# Patient Record
Sex: Male | Born: 1980 | Race: White | Hispanic: No | Marital: Married | Smoking: Never smoker
Health system: Southern US, Community
[De-identification: ages and names within clinical notes are randomized; demographics above are authoritative.]

## PROBLEM LIST (undated history)

## (undated) HISTORY — PX: COLONOSCOPY: SHX174

## (undated) HISTORY — PX: WISDOM TOOTH EXTRACTION: SHX21

---

## 2002-07-12 ENCOUNTER — Emergency Department (HOSPITAL_COMMUNITY): Admission: EM | Admit: 2002-07-12 | Discharge: 2002-07-12 | Payer: Self-pay | Admitting: Emergency Medicine

## 2002-10-30 ENCOUNTER — Emergency Department (HOSPITAL_COMMUNITY): Admission: EM | Admit: 2002-10-30 | Discharge: 2002-10-30 | Payer: Self-pay | Admitting: Emergency Medicine

## 2003-03-18 ENCOUNTER — Emergency Department (HOSPITAL_COMMUNITY): Admission: AD | Admit: 2003-03-18 | Discharge: 2003-03-18 | Payer: Self-pay | Admitting: Family Medicine

## 2003-03-24 ENCOUNTER — Emergency Department (HOSPITAL_COMMUNITY): Admission: AD | Admit: 2003-03-24 | Discharge: 2003-03-24 | Payer: Self-pay | Admitting: Family Medicine

## 2003-04-21 ENCOUNTER — Emergency Department (HOSPITAL_COMMUNITY): Admission: EM | Admit: 2003-04-21 | Discharge: 2003-04-21 | Payer: Self-pay | Admitting: Emergency Medicine

## 2005-10-06 ENCOUNTER — Emergency Department (HOSPITAL_COMMUNITY): Admission: EM | Admit: 2005-10-06 | Discharge: 2005-10-06 | Payer: Self-pay | Admitting: Emergency Medicine

## 2008-02-20 ENCOUNTER — Emergency Department (HOSPITAL_BASED_OUTPATIENT_CLINIC_OR_DEPARTMENT_OTHER): Admission: EM | Admit: 2008-02-20 | Discharge: 2008-02-20 | Payer: Self-pay | Admitting: Emergency Medicine

## 2010-08-31 ENCOUNTER — Ambulatory Visit (INDEPENDENT_AMBULATORY_CARE_PROVIDER_SITE_OTHER): Payer: 59 | Admitting: Family Medicine

## 2010-08-31 ENCOUNTER — Encounter: Payer: Self-pay | Admitting: Family Medicine

## 2010-08-31 VITALS — BP 124/88 | Ht 71.0 in | Wt 155.0 lb

## 2010-08-31 DIAGNOSIS — S92309A Fracture of unspecified metatarsal bone(s), unspecified foot, initial encounter for closed fracture: Secondary | ICD-10-CM | POA: Insufficient documentation
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
## 2010-09-20 ENCOUNTER — Ambulatory Visit (INDEPENDENT_AMBULATORY_CARE_PROVIDER_SITE_OTHER): Payer: 59 | Admitting: Family Medicine

## 2010-09-20 ENCOUNTER — Encounter: Payer: Self-pay | Admitting: Family Medicine

## 2010-09-20 VITALS — BP 130/89 | HR 79

## 2010-09-20 DIAGNOSIS — S92309A Fracture of unspecified metatarsal bone(s), unspecified foot, initial encounter for closed fracture: Secondary | ICD-10-CM

## 2010-09-20 NOTE — Progress Notes (Signed)
  Subjective:    Patient ID: Randall Webb, male    DOB: March 21, 1981, 30 y.o.   MRN: [id number]  HPI  Followup metatarsal fracture. Overall 70% improved. Has been wearing the postop shoe at the gym and most of the time when he is not at work. He has not been running. He tried the elliptical machine and was painful so he did not do that he is essentially pain free while at rest, standing, and walking.  Review of Systems    denies fever. Objective:   Physical Exam    GENERAL: Well-developed male no acute distress FOOT: Right foot nontender to palpation over the metatarsals. He does have some pain with resisted eversion and inversion. He is Neurovascularly intact. SKIN: Well-healed scar or some of the foot    Assessment & Plan:  Metatarsal fracture healing well. We'll continue the postop shoe during exercise. We'll let him try the elliptical. If it is painful he will refrain for a week and then retry it. I would like to see him back one to 2 weeks after he is able to do the elliptical without pain while in the postop shoe. At that point we can discuss leading to run. He is comfortable with this plan.

## 2010-12-28 LAB — COMPREHENSIVE METABOLIC PANEL
AST: 28
Albumin: 5.1
Calcium: 9.6
Creatinine, Ser: 1.2
GFR calc Af Amer: >60
GFR calc non Af Amer: >60

## 2010-12-28 LAB — DIFFERENTIAL
Eosinophils Relative: 2
Lymphocytes Relative: 12
Lymphs Abs: 1

## 2010-12-28 LAB — URINE MICROSCOPIC-ADD ON

## 2010-12-28 LAB — URINALYSIS, ROUTINE W REFLEX MICROSCOPIC
Leukocytes, UA: NEGATIVE
Nitrite: NEGATIVE
Specific Gravity, Urine: 1.031 — ABNORMAL HIGH
pH: 5.5

## 2010-12-28 LAB — CBC
MCHC: 33.3
MCV: 89.9
Platelets: 221

## 2010-12-28 LAB — LIPASE, BLOOD: Lipase: 56

## 2010-12-28 LAB — URINE CULTURE
Colony Count: NO GROWTH
Culture: NO GROWTH

## 2011-09-13 ENCOUNTER — Other Ambulatory Visit: Payer: Self-pay | Admitting: Family Medicine

## 2011-09-13 DIAGNOSIS — N179 Acute kidney failure, unspecified: Secondary | ICD-10-CM

## 2011-09-19 ENCOUNTER — Ambulatory Visit
Admission: RE | Admit: 2011-09-19 | Discharge: 2011-09-19 | Disposition: A | Discharge status: Home / Self Care | Payer: 59 | Source: Ambulatory Visit | Attending: Family Medicine | Admitting: Family Medicine

## 2011-09-19 DIAGNOSIS — N179 Acute kidney failure, unspecified: Secondary | ICD-10-CM

## 2014-07-11 ENCOUNTER — Ambulatory Visit: Payer: Self-pay | Admitting: Urology

## 2014-09-26 ENCOUNTER — Ambulatory Visit: Payer: Self-pay | Admitting: Urology

## 2014-10-20 ENCOUNTER — Other Ambulatory Visit: Payer: Self-pay | Admitting: Orthopedic Surgery

## 2014-10-20 DIAGNOSIS — M25511 Pain in right shoulder: Secondary | ICD-10-CM

## 2014-10-28 ENCOUNTER — Ambulatory Visit
Admission: RE | Admit: 2014-10-28 | Discharge: 2014-10-28 | Disposition: A | Discharge status: Home / Self Care | Payer: Commercial Managed Care - HMO | Source: Ambulatory Visit | Attending: Orthopedic Surgery | Admitting: Orthopedic Surgery

## 2014-10-28 DIAGNOSIS — M25511 Pain in right shoulder: Secondary | ICD-10-CM

## 2014-11-11 ENCOUNTER — Other Ambulatory Visit: Payer: Self-pay | Admitting: Orthopedic Surgery

## 2014-11-28 ENCOUNTER — Ambulatory Visit: Payer: Commercial Managed Care - HMO | Admitting: Urology

## 2014-12-04 ENCOUNTER — Encounter (HOSPITAL_COMMUNITY): Payer: Self-pay

## 2014-12-04 ENCOUNTER — Encounter (HOSPITAL_COMMUNITY)
Admission: RE | Admit: 2014-12-04 | Discharge: 2014-12-04 | Disposition: A | Discharge status: Home / Self Care | Payer: Commercial Managed Care - HMO | Source: Ambulatory Visit | Attending: Orthopedic Surgery | Admitting: Orthopedic Surgery

## 2014-12-04 DIAGNOSIS — M7541 Impingement syndrome of right shoulder: Secondary | ICD-10-CM | POA: Diagnosis not present

## 2014-12-04 DIAGNOSIS — Z01812 Encounter for preprocedural laboratory examination: Secondary | ICD-10-CM | POA: Insufficient documentation

## 2014-12-04 LAB — CBC
HCT: 38 % — ABNORMAL LOW (ref 39.0–52.0)
HEMOGLOBIN: 12.8 g/dL — AB (ref 13.0–17.0)
MCH: 29.3 pg (ref 26.0–34.0)
MCHC: 33.7 g/dL (ref 30.0–36.0)
MCV: 87 fL (ref 78.0–100.0)
Platelets: 190 10*3/uL (ref 150–400)
RBC: 4.37 MIL/uL (ref 4.22–5.81)
RDW: 12.8 % (ref 11.5–15.5)
WBC: 3.6 10*3/uL — AB (ref 4.0–10.5)

## 2014-12-04 NOTE — Pre-Procedure Instructions (Signed)
    HELMUTH RECUPERO  12/04/2014      [id number] - [place], [place] - [address] [place] [address] Phone: [phone] Fax: [phone]    Your procedure is scheduled on Monday December 15 2014 at 845 am.  Report to [facility] Admitting at [phone] A.M.  Call this number if you have problems the morning of surgery:  [phone]   Call this number if you have problems in the days leading up to your surgery that the doctor's office cannot answer:  [phone]    Remember:  Do not eat food or drink liquids after midnight Sunday Sept. 18th.  Take these medicines the morning of surgery with A SIP OF WATER Fexofenadine (Allegra)   STOP: ALL Vitamins, Supplements, Effient and Herbal Medications, Fish Oils, Aspirins, NSAIDs (Nonsteroidal Anti-inflammatories such as Ibuprofen, Aleve, or Advil), and Goody's/BC Powders 7 days prior to surgery, until after surgery as directed by your physician. STOP Ibuprofen on Monday the 12th.      Do not wear jewelry.  Do not wear lotions, powders, or perfumes.   Do not shave 48 hours prior to surgery.  Men may shave face and neck.  Do not bring valuables to the hospital.  [facility] is not responsible for any belongings or valuables.  Contacts, dentures or bridgework may not be worn into surgery.  Leave your suitcase in the car.  After surgery it may be brought to your room.  For patients admitted to the hospital, discharge time will be determined by your treatment team.  Patients discharged the day of surgery will not be allowed to drive home.   Special instructions:  Please follow these instructions carefully:  1. Shower with CHG Soap the night before surgery and the morning of Surgery. 2. If you choose to wash your hair, wash your hair first as usual with your normal shampoo. 3. After you shampoo, rinse your hair and  body thoroughly to remove the Shampoo. 4. Use CHG as you would any other liquid soap. You can apply chg directly to the skin and wash gently with scrungie or a clean washcloth. 5. Apply the CHG Soap to your body ONLY FROM THE NECK DOWN. Do not use on open wounds or open sores. Avoid contact with your eyes, ears, mouth and genitals (private parts). Wash genitals (private parts) with your normal soap. 6. Wash thoroughly, paying special attention to the area where your surgery will be performed. 7. Thoroughly rinse your body with warm water from the neck down. 8. DO NOT shower/wash with your normal soap after using and rinsing off the CHG Soap. 9. Pat yourself dry with a clean towel.  10. Wear clean pajamas.  11. Place clean sheets on your bed the night of your first shower and do not sleep with pets.  Day of Surgery  Do not apply any lotions/deodorants the morning of surgery. Please wear clean clothes to the hospital/surgery center.    Please read over the following fact sheets that you were given. Pain Booklet, Coughing and Deep Breathing and Surgical Site Infection Prevention

## 2014-12-14 NOTE — Anesthesia Preprocedure Evaluation (Addendum)
Anesthesia Evaluation  Patient identified by MRN, date of birth, ID band Patient awake    Reviewed: Allergy & Precautions, NPO status , Patient's Chart, lab work & pertinent test results  Airway Mallampati: II  TM Distance: >3 FB Neck ROM: Full    Dental no notable dental hx.    Pulmonary neg pulmonary ROS,    Pulmonary exam normal breath sounds clear to auscultation       Cardiovascular negative cardio ROS Normal cardiovascular exam Rhythm:Regular Rate:Normal     Neuro/Psych negative neurological ROS  negative psych ROS   GI/Hepatic negative GI ROS, Neg liver ROS,   Endo/Other  negative endocrine ROS  Renal/GU negative Renal ROS     Musculoskeletal negative musculoskeletal ROS (+)   Abdominal   Peds  Hematology negative hematology ROS (+)   Anesthesia Other Findings   Reproductive/Obstetrics                            Anesthesia Physical Anesthesia Plan  ASA: I  Anesthesia Plan: General and Regional   Post-op Pain Management: GA combined w/ Regional for post-op pain   Induction: Intravenous  Airway Management Planned: Oral ETT  Additional Equipment:   Intra-op Plan:   Post-operative Plan: Extubation in OR  Informed Consent: I have reviewed the patients History and Physical, chart, labs and discussed the procedure including the risks, benefits and alternatives for the proposed anesthesia with the patient or authorized representative who has indicated his/her understanding and acceptance.   Dental advisory given  Plan Discussed with: CRNA  Anesthesia Plan Comments:         Anesthesia Quick Evaluation

## 2014-12-15 ENCOUNTER — Encounter (HOSPITAL_COMMUNITY)
Admission: RE | Disposition: A | Discharge status: Home / Self Care | Payer: Self-pay | Source: Ambulatory Visit | Attending: Orthopedic Surgery

## 2014-12-15 ENCOUNTER — Ambulatory Visit (HOSPITAL_COMMUNITY): Payer: Commercial Managed Care - HMO | Admitting: Anesthesiology

## 2014-12-15 ENCOUNTER — Encounter (HOSPITAL_COMMUNITY): Payer: Self-pay

## 2014-12-15 ENCOUNTER — Ambulatory Visit (HOSPITAL_COMMUNITY)
Admission: RE | Admit: 2014-12-15 | Discharge: 2014-12-15 | Disposition: A | Discharge status: Home / Self Care | Payer: Commercial Managed Care - HMO | Source: Ambulatory Visit | Attending: Orthopedic Surgery | Admitting: Orthopedic Surgery

## 2014-12-15 DIAGNOSIS — M7541 Impingement syndrome of right shoulder: Secondary | ICD-10-CM | POA: Insufficient documentation

## 2014-12-15 DIAGNOSIS — Z791 Long term (current) use of non-steroidal anti-inflammatories (NSAID): Secondary | ICD-10-CM | POA: Diagnosis not present

## 2014-12-15 HISTORY — PX: SHOULDER ARTHROSCOPY WITH SUBACROMIAL DECOMPRESSION: SHX5684

## 2014-12-15 HISTORY — PX: SHOULDER ARTHROSCOPY WITH DISTAL CLAVICLE RESECTION: SHX5675

## 2014-12-15 SURGERY — SHOULDER ARTHROSCOPY WITH SUBACROMIAL DECOMPRESSION
Anesthesia: Regional | Laterality: Right

## 2014-12-15 MED ORDER — SODIUM CHLORIDE 0.9 % IJ SOLN
INTRAMUSCULAR | Status: AC
  Filled 2014-12-15: qty 10

## 2014-12-15 MED ORDER — ROCURONIUM BROMIDE 100 MG/10ML IV SOLN
INTRAVENOUS | Status: DC | PRN
  Administered 2014-12-15: 10 mg via INTRAVENOUS
  Administered 2014-12-15: 30 mg via INTRAVENOUS

## 2014-12-15 MED ORDER — GLYCOPYRROLATE 0.2 MG/ML IJ SOLN
INTRAMUSCULAR | Status: DC | PRN
  Administered 2014-12-15: .4 mg via INTRAVENOUS

## 2014-12-15 MED ORDER — MIDAZOLAM HCL 5 MG/5ML IJ SOLN
INTRAMUSCULAR | Status: DC | PRN
  Administered 2014-12-15: 2 mg via INTRAVENOUS

## 2014-12-15 MED ORDER — HYDROCODONE-ACETAMINOPHEN 5-325 MG PO TABS: 1.0000 | ORAL_TABLET | Freq: Four times a day (QID) | ORAL | Status: AC | PRN

## 2014-12-15 MED ORDER — ROCURONIUM BROMIDE 50 MG/5ML IV SOLN
INTRAVENOUS | Status: AC
  Filled 2014-12-15: qty 1

## 2014-12-15 MED ORDER — CHLORHEXIDINE GLUCONATE 4 % EX LIQD: 60.0000 mL | Freq: Once | CUTANEOUS | Status: DC

## 2014-12-15 MED ORDER — MIDAZOLAM HCL 2 MG/2ML IJ SOLN
INTRAMUSCULAR | Status: AC
  Administered 2014-12-15: 2 mg
  Filled 2014-12-15: qty 2

## 2014-12-15 MED ORDER — SUCCINYLCHOLINE CHLORIDE 20 MG/ML IJ SOLN
INTRAMUSCULAR | Status: AC
  Filled 2014-12-15: qty 1

## 2014-12-15 MED ORDER — BUPIVACAINE-EPINEPHRINE (PF) 0.5% -1:200000 IJ SOLN
INTRAMUSCULAR | Status: DC | PRN
  Administered 2014-12-15: 30 mL via PERINEURAL

## 2014-12-15 MED ORDER — LACTATED RINGERS IV SOLN
INTRAVENOUS | Status: DC
  Administered 2014-12-15 (×2): via INTRAVENOUS

## 2014-12-15 MED ORDER — ONDANSETRON HCL 4 MG/2ML IJ SOLN
INTRAMUSCULAR | Status: DC | PRN
  Administered 2014-12-15: 4 mg via INTRAVENOUS

## 2014-12-15 MED ORDER — NEOSTIGMINE METHYLSULFATE 10 MG/10ML IV SOLN
INTRAVENOUS | Status: AC
  Filled 2014-12-15: qty 1

## 2014-12-15 MED ORDER — PROMETHAZINE HCL 25 MG/ML IJ SOLN: 6.2500 mg | INTRAMUSCULAR | Status: DC | PRN

## 2014-12-15 MED ORDER — CEFAZOLIN SODIUM-DEXTROSE 2-3 GM-% IV SOLR
INTRAVENOUS | Status: AC
  Administered 2014-12-15: 2 g via INTRAVENOUS
  Filled 2014-12-15: qty 50

## 2014-12-15 MED ORDER — LIDOCAINE HCL (CARDIAC) 20 MG/ML IV SOLN
INTRAVENOUS | Status: DC | PRN
  Administered 2014-12-15: 40 mg via INTRAVENOUS

## 2014-12-15 MED ORDER — MEPERIDINE HCL 25 MG/ML IJ SOLN: 6.2500 mg | INTRAMUSCULAR | Status: DC | PRN

## 2014-12-15 MED ORDER — PROPOFOL 10 MG/ML IV BOLUS
INTRAVENOUS | Status: AC
  Filled 2014-12-15: qty 20

## 2014-12-15 MED ORDER — ROCURONIUM BROMIDE 100 MG/10ML IV SOLN: INTRAVENOUS | Status: DC | PRN

## 2014-12-15 MED ORDER — EPHEDRINE SULFATE 50 MG/ML IJ SOLN
INTRAMUSCULAR | Status: AC
  Filled 2014-12-15: qty 1

## 2014-12-15 MED ORDER — NEOSTIGMINE METHYLSULFATE 10 MG/10ML IV SOLN
INTRAVENOUS | Status: DC | PRN
  Administered 2014-12-15: 3 mg via INTRAVENOUS

## 2014-12-15 MED ORDER — LIDOCAINE HCL (CARDIAC) 20 MG/ML IV SOLN
INTRAVENOUS | Status: AC
  Filled 2014-12-15: qty 10

## 2014-12-15 MED ORDER — GLYCOPYRROLATE 0.2 MG/ML IJ SOLN
INTRAMUSCULAR | Status: AC
  Filled 2014-12-15: qty 2

## 2014-12-15 MED ORDER — MIDAZOLAM HCL 2 MG/2ML IJ SOLN
INTRAMUSCULAR | Status: AC
  Filled 2014-12-15: qty 4

## 2014-12-15 MED ORDER — SODIUM CHLORIDE 0.9 % IR SOLN
Status: DC | PRN
  Administered 2014-12-15 (×2): 3000 mL

## 2014-12-15 MED ORDER — BUPIVACAINE-EPINEPHRINE (PF) 0.5% -1:200000 IJ SOLN
INTRAMUSCULAR | Status: AC
  Filled 2014-12-15: qty 30

## 2014-12-15 MED ORDER — PROPOFOL 10 MG/ML IV BOLUS
INTRAVENOUS | Status: DC | PRN
  Administered 2014-12-15: 200 mg via INTRAVENOUS

## 2014-12-15 MED ORDER — FENTANYL CITRATE (PF) 250 MCG/5ML IJ SOLN
INTRAMUSCULAR | Status: AC
  Filled 2014-12-15: qty 5

## 2014-12-15 MED ORDER — FENTANYL CITRATE (PF) 100 MCG/2ML IJ SOLN
INTRAMUSCULAR | Status: AC
  Administered 2014-12-15: 100 ug
  Filled 2014-12-15: qty 2

## 2014-12-15 MED ORDER — HYDROMORPHONE HCL 1 MG/ML IJ SOLN: 0.2500 mg | INTRAMUSCULAR | Status: DC | PRN

## 2014-12-15 MED ORDER — IBUPROFEN 800 MG PO TABS: 800.0000 mg | ORAL_TABLET | Freq: Three times a day (TID) | ORAL | Status: AC

## 2014-12-15 MED ORDER — CEFAZOLIN SODIUM-DEXTROSE 2-3 GM-% IV SOLR: 2.0000 g | INTRAVENOUS | Status: DC

## 2014-12-15 MED ORDER — FENTANYL CITRATE (PF) 100 MCG/2ML IJ SOLN
INTRAMUSCULAR | Status: DC | PRN
  Administered 2014-12-15 (×2): 50 ug via INTRAVENOUS

## 2014-12-15 SURGICAL SUPPLY — 57 items
BIT DRILL TAK (DRILL) IMPLANT
BLADE CUTTER GATOR 3.5 (BLADE) IMPLANT
BLADE GREAT WHITE 4.2 (BLADE) ×2 IMPLANT
BLADE SURG 11 STRL SS (BLADE) ×2 IMPLANT
BUR OVAL 4.0 (BURR) ×2 IMPLANT
CANNULA SHOULDER 7CM (CANNULA) ×2 IMPLANT
CLEANER TIP ELECTROSURG 2X2 (MISCELLANEOUS) IMPLANT
DRAPE INCISE IOBAN 66X45 STRL (DRAPES) IMPLANT
DRAPE U-SHAPE 47X51 STRL (DRAPES) ×2 IMPLANT
DRESSING ADAPTIC 1/2  N-ADH (PACKING) ×2 IMPLANT
DRILL TAK (DRILL)
DRSG EMULSION OIL 3X3 NADH (GAUZE/BANDAGES/DRESSINGS) ×2 IMPLANT
DRSG PAD ABDOMINAL 8X10 ST (GAUZE/BANDAGES/DRESSINGS) ×6 IMPLANT
DURAPREP 26ML APPLICATOR (WOUND CARE) ×2 IMPLANT
ELECT REM PT RETURN 9FT ADLT (ELECTROSURGICAL) ×2
ELECTRODE REM PT RTRN 9FT ADLT (ELECTROSURGICAL) ×1 IMPLANT
GAUZE SPONGE 4X4 12PLY STRL (GAUZE/BANDAGES/DRESSINGS) ×2 IMPLANT
GLOVE BIOGEL M 7.0 STRL (GLOVE) ×4 IMPLANT
GLOVE BIOGEL PI IND STRL 7.5 (GLOVE) ×2 IMPLANT
GLOVE BIOGEL PI IND STRL 8.5 (GLOVE) ×2 IMPLANT
GLOVE BIOGEL PI INDICATOR 7.5 (GLOVE) ×2
GLOVE BIOGEL PI INDICATOR 8.5 (GLOVE) ×2
GLOVE SURG ORTHO 8.0 STRL STRW (GLOVE) ×4 IMPLANT
GOWN STRL REUS W/ TWL LRG LVL3 (GOWN DISPOSABLE) ×2 IMPLANT
GOWN STRL REUS W/ TWL XL LVL3 (GOWN DISPOSABLE) ×1 IMPLANT
GOWN STRL REUS W/TWL LRG LVL3 (GOWN DISPOSABLE) ×4
GOWN STRL REUS W/TWL XL LVL3 (GOWN DISPOSABLE) ×2
KIT BASIN OR (CUSTOM PROCEDURE TRAY) ×2 IMPLANT
KIT ROOM TURNOVER OR (KITS) ×2 IMPLANT
MANIFOLD NEPTUNE II (INSTRUMENTS) ×2 IMPLANT
NEEDLE 1/2 CIR CATGUT .05X1.09 (NEEDLE) IMPLANT
NEEDLE HYPO 25GX1X1/2 BEV (NEEDLE) IMPLANT
NEEDLE SPNL 18GX3.5 QUINCKE PK (NEEDLE) ×2 IMPLANT
NS IRRIG 1000ML POUR BTL (IV SOLUTION) IMPLANT
PACK SHOULDER (CUSTOM PROCEDURE TRAY) ×2 IMPLANT
PAD ARMBOARD 7.5X6 YLW CONV (MISCELLANEOUS) ×4 IMPLANT
SET ARTHROSCOPY TUBING (MISCELLANEOUS) ×1
SET ARTHROSCOPY TUBING LN (MISCELLANEOUS) ×1 IMPLANT
SLING ARM IMMOBILIZER MED (SOFTGOODS) ×2 IMPLANT
SPEAR FASTAKII (SLEEVE) IMPLANT
SPONGE GAUZE 4X4 12PLY STER LF (GAUZE/BANDAGES/DRESSINGS) ×2 IMPLANT
SPONGE LAP 4X18 X RAY DECT (DISPOSABLE) ×2 IMPLANT
STRIP CLOSURE SKIN 1/2X4 (GAUZE/BANDAGES/DRESSINGS) IMPLANT
SUCTION FRAZIER TIP 10 FR DISP (SUCTIONS) IMPLANT
SUT ETHIBOND 2 OS 4 DA (SUTURE) IMPLANT
SUT ETHILON 3 0 PS 1 (SUTURE) ×2 IMPLANT
SUT PROLENE 4 0 PS 2 18 (SUTURE) IMPLANT
SUT VIC AB 0 CT1 27 (SUTURE)
SUT VIC AB 0 CT1 27XBRD ANBCTR (SUTURE) IMPLANT
SUT VIC AB 2-0 CT1 27 (SUTURE)
SUT VIC AB 2-0 CT1 TAPERPNT 27 (SUTURE) IMPLANT
SYR 50ML SLIP (SYRINGE) ×2 IMPLANT
SYR CONTROL 10ML LL (SYRINGE) IMPLANT
TAPE CLOTH SURG 6X10 WHT LF (GAUZE/BANDAGES/DRESSINGS) ×2 IMPLANT
TOWEL OR 17X24 6PK STRL BLUE (TOWEL DISPOSABLE) ×2 IMPLANT
WAND HAND CNTRL MULTIVAC 90 (MISCELLANEOUS) ×2 IMPLANT
WATER STERILE IRR 1000ML POUR (IV SOLUTION) ×2 IMPLANT

## 2014-12-15 NOTE — Addendum Note (Signed)
Addendum  created 12/15/14 1121 by Lewie Loron, MD   Modules edited: Anesthesia Medication Administration

## 2014-12-15 NOTE — Op Note (Addendum)
NAMETAYDEN, Randall Webb        ACCOUNT NO.:  [id number]  MEDICAL RECORD NO.:  [id number]  LOCATION:  [facility]                         FACILITY:  [facility]  PHYSICIAN:  Mila Homer. Sherlean Foot, M.D. DATE OF BIRTH:  [birth year]  DATE OF PROCEDURE:  12/15/2014 DATE OF DISCHARGE:  12/15/2014                              OPERATIVE REPORT   SURGEON:  Mila Homer. Sherlean Foot, M.D.  ASSISTANT:  Lelan Pons, PA-C.  ANESTHESIA:  General plus preoperative interscalene block.  COMPLICATIONS:  None.  DRAINS:  None.  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 Informed consent was obtained.  DESCRIPTION OF PROCEDURE:  The patient was laid supine, administered general anesthesia, placed in beach chair position.  The right shoulder was prepped and draped in usual fashion.  Posterior and direct lateral portals were created with an 11 blade blunt trocar and cannula. Diagnostic arthroscopy of the glenohumeral joint looked excellent.  I then redirected the scope into the subacromial space from the lateral portal, performed a bursectomy.  I then used the ArthroCare debridement wand to clean off the undersurface of the acromion and distal clavicle. I also used the wand to release the CA ligament.  I then used a 4.0 mm cylindrical burr to perform an aggressive acromioplasty as well as distal clavicle resection removing approximately 10-12 mm off the distal clavicle in the inferior surface.  I then used a shaver to complete my bursectomy.  There were some partial thickness tearing of the supraspinatus which was debrided lightly. We had established a lot of space and eliminated all impingement.  I then lavaged and closed with 4-0 nylon sutures, dressed with Xeroform, dressing sponges, sterile Webril, near shoulder dressing, and a simple sling.          ______________________________ Mila Homer. Sherlean Foot,  M.D.     SDL/MEDQ  D:  12/15/2014  T:  12/15/2014  Job:  [id number]

## 2014-12-15 NOTE — Op Note (Signed)
Dictation Number:  [id number]

## 2014-12-15 NOTE — Anesthesia Postprocedure Evaluation (Signed)
Anesthesia Post Note  Patient: Randall Webb  Procedure(s) Performed: Procedure(s) (LRB): SHOULDER ARTHROSCOPY WITH SUBACROMIAL DECOMPRESSION (Right) SHOULDER ARTHROSCOPY WITH DISTAL CLAVICLE RESECTION (Right)  Anesthesia type: General + ISB  Patient location: PACU  Post pain: Pain level controlled  Post assessment: Post-op Vital signs reviewed  Last Vitals: BP 128/83 mmHg  Pulse 55  Temp(Src) 36.5 C (Oral)  Resp 14  Ht  (1.803 m)  Wt 151 lb (68.493 kg)  BMI 21.07 kg/m2  SpO2 99%  Post vital signs: Reviewed  Level of consciousness: sedated  Complications: No apparent anesthesia complications

## 2014-12-15 NOTE — H&P (Signed)
  NAME: Randall Webb MRN:   [id number] DOB:   [birth year]     HISTORY AND PHYSICAL  CHIEF COMPLAINT:  Right shoulder pain  HISTORY:   Randall Webb a 34 y.o. male  with right  Shoulder Pain Patient complains of right shoulder pain. The symptoms began several months ago. Aggravating factors: repetitive activity. Pain is located around the acromioclavicular [facility]) joint. Discomfort is described as aching. Symptoms are exacerbated by repetitive movements. Evaluation to date: plain films: normal and MRI: abnormal ac oa, type 2 acromion. Therapy to date includes: rest, ice, avoidance of offending activity, prescription NSAIDS which are somewhat effective, home exercises which are somewhat effective and corticosteroid injection which was somewhat effective.   PAST MEDICAL HISTORY:  No past medical history on file.  PAST SURGICAL HISTORY:   Past Surgical History  Procedure Laterality Date  . Wisdom tooth extraction    . Colonoscopy      MEDICATIONS:   Medications Prior to Admission  Medication Sig Dispense Refill  . fexofenadine (ALLEGRA) 180 MG tablet Take 180 mg by mouth daily.    Marland Kitchen ibuprofen (ADVIL,MOTRIN) 200 MG tablet Take 600 mg by mouth every 8 (eight) hours as needed for mild pain or moderate pain.      ALLERGIES:  No Known Allergies  REVIEW OF SYSTEMS:   Negative except HPI  FAMILY HISTORY:  No family history on file.  SOCIAL HISTORY:   reports that he has never smoked. He has never used smokeless tobacco. He reports that he drinks alcohol. He reports that he does not use illicit drugs.  PHYSICAL EXAM:  General appearance: alert, cooperative and no distress Resp: clear to auscultation bilaterally Cardio: regular rate and rhythm, S1, S2 normal, no murmur, click, rub or gallop GI: soft, non-tender; bowel sounds normal; no masses,  no organomegaly Extremities: extremities normal, atraumatic, no cyanosis or edema and Homans sign is negative, no sign of DVT Pulses:  2+ and symmetric Skin: Skin color, texture, turgor normal. No rashes or lesions Neurologic: Alert and oriented X 3, normal strength and tone. Normal symmetric reflexes. Normal coordination and gait    LABORATORY STUDIES: No results for input(s): WBC, HGB, HCT, PLT in the last 72 hours.  No results for input(s): NA, K, CL, CO2, GLUCOSE, BUN, CREATININE, CALCIUM in the last 72 hours.  STUDIES/RESULTS:  MRI: AC joint OA  ASSESSMENT:  Right shoulder impingement        Active Problems:   * No active hospital problems. *    PLAN: Right shoulder decompression   JONES,MAURICE 12/15/2014. 6:45 AM

## 2014-12-15 NOTE — Transfer of Care (Signed)
Immediate Anesthesia Transfer of Care Note  Patient: Randall Webb  Procedure(s) Performed: Procedure(s): SHOULDER ARTHROSCOPY WITH SUBACROMIAL DECOMPRESSION (Right) SHOULDER ARTHROSCOPY WITH DISTAL CLAVICLE RESECTION (Right)  Patient Location: PACU  Anesthesia Type:General and Regional  Level of Consciousness: awake, alert  and oriented  Airway & Oxygen Therapy: Patient Spontanous Breathing and Patient connected to nasal cannula oxygen  Post-op Assessment: Report given to RN and Post -op Vital signs reviewed and stable  Post vital signs: Reviewed and stable  Last Vitals:  Filed Vitals:   12/15/14 0840  BP: 155/89  Pulse: 81  Temp:   Resp:     Complications: No apparent anesthesia complications

## 2014-12-15 NOTE — Anesthesia Procedure Notes (Addendum)
Procedure Name: Intubation Date/Time: 12/15/2014 9:17 AM Performed by: Marena Chancy Pre-anesthesia Checklist: Patient identified Oxygen Delivery Method: Circle system utilized Preoxygenation: Pre-oxygenation with 100% oxygen Intubation Type: IV induction Ventilation: Mask ventilation without difficulty Laryngoscope Size: Miller and 2 Grade View: Grade II Tube type: Oral Tube size: 7.5 mm Number of attempts: 1 Placement Confirmation: ETT inserted through vocal cords under direct vision,  positive ETCO2 and breath sounds checked- equal and bilateral Tube secured with: Tape Dental Injury: Teeth and Oropharynx as per pre-operative assessment    Anesthesia Regional Block:  Interscalene brachial plexus block  Pre-Anesthetic Checklist: ,, timeout performed, Correct Patient, Correct Site, Correct Laterality, Correct Procedure, Correct Position, site marked, Risks and benefits discussed, Surgical consent,  Pre-op evaluation,  Post-op pain management  Laterality: Right  Prep: chloraprep       Needles:  Injection technique: Single-shot  Needle Type: Stimulator Needle - 40     Needle Length: 4cm 4 cm Needle Gauge: 22 and 22 G    Additional Needles:  Procedures: ultrasound guided (picture in chart) Interscalene brachial plexus block Narrative:  Injection made incrementally with aspirations every 5 mL.  Performed by: Personally  Anesthesiologist: Lewie Loron  Additional Notes: BP cuff, EKG monitors applied. Sedation begun. Nerve location verified with U/S. Anesthetic injected incrementally, slowly , and after neg aspirations under direct u/s guidance. Good perineural spread. Tolerated well.

## 2014-12-16 ENCOUNTER — Encounter (HOSPITAL_COMMUNITY): Payer: Self-pay | Admitting: Orthopedic Surgery

## 2016-06-30 DIAGNOSIS — H01001 Unspecified blepharitis right upper eyelid: Secondary | ICD-10-CM | POA: Diagnosis not present

## 2016-07-25 DIAGNOSIS — S60948A Unspecified superficial injury of other finger, initial encounter: Secondary | ICD-10-CM | POA: Diagnosis not present

## 2016-09-12 DIAGNOSIS — J309 Allergic rhinitis, unspecified: Secondary | ICD-10-CM | POA: Diagnosis not present

## 2016-09-12 DIAGNOSIS — J018 Other acute sinusitis: Secondary | ICD-10-CM | POA: Diagnosis not present

## 2016-09-12 DIAGNOSIS — R05 Cough: Secondary | ICD-10-CM | POA: Diagnosis not present

## 2017-01-20 DIAGNOSIS — N410 Acute prostatitis: Secondary | ICD-10-CM | POA: Diagnosis not present

## 2017-01-20 DIAGNOSIS — R3915 Urgency of urination: Secondary | ICD-10-CM | POA: Diagnosis not present

## 2017-02-03 DIAGNOSIS — R3129 Other microscopic hematuria: Secondary | ICD-10-CM | POA: Diagnosis not present

## 2017-02-03 DIAGNOSIS — N410 Acute prostatitis: Secondary | ICD-10-CM | POA: Diagnosis not present

## 2017-02-03 DIAGNOSIS — N23 Unspecified renal colic: Secondary | ICD-10-CM | POA: Diagnosis not present

## 2017-02-06 ENCOUNTER — Other Ambulatory Visit: Payer: Self-pay | Admitting: Family Medicine

## 2017-02-06 DIAGNOSIS — N23 Unspecified renal colic: Secondary | ICD-10-CM

## 2017-02-07 ENCOUNTER — Ambulatory Visit
Admission: RE | Admit: 2017-02-07 | Discharge: 2017-02-07 | Disposition: A | Discharge status: Home / Self Care | Payer: Commercial Managed Care - HMO | Source: Ambulatory Visit | Attending: Family Medicine | Admitting: Family Medicine

## 2017-02-07 DIAGNOSIS — R3129 Other microscopic hematuria: Secondary | ICD-10-CM | POA: Diagnosis not present

## 2017-02-07 DIAGNOSIS — N23 Unspecified renal colic: Secondary | ICD-10-CM

## 2017-02-22 DIAGNOSIS — R1084 Generalized abdominal pain: Secondary | ICD-10-CM | POA: Diagnosis not present

## 2017-02-22 DIAGNOSIS — R350 Frequency of micturition: Secondary | ICD-10-CM | POA: Diagnosis not present

## 2017-08-29 DIAGNOSIS — M25511 Pain in right shoulder: Secondary | ICD-10-CM | POA: Diagnosis not present

## 2017-08-30 DIAGNOSIS — M25511 Pain in right shoulder: Secondary | ICD-10-CM | POA: Diagnosis not present

## 2017-09-05 DIAGNOSIS — M25511 Pain in right shoulder: Secondary | ICD-10-CM | POA: Diagnosis not present

## 2017-10-16 DIAGNOSIS — R05 Cough: Secondary | ICD-10-CM | POA: Diagnosis not present

## 2017-10-20 DIAGNOSIS — J209 Acute bronchitis, unspecified: Secondary | ICD-10-CM | POA: Diagnosis not present

## 2017-12-01 DIAGNOSIS — Z87442 Personal history of urinary calculi: Secondary | ICD-10-CM | POA: Diagnosis not present

## 2017-12-01 DIAGNOSIS — R350 Frequency of micturition: Secondary | ICD-10-CM | POA: Diagnosis not present

## 2017-12-08 DIAGNOSIS — Z87442 Personal history of urinary calculi: Secondary | ICD-10-CM | POA: Diagnosis not present

## 2017-12-08 DIAGNOSIS — R350 Frequency of micturition: Secondary | ICD-10-CM | POA: Diagnosis not present

## 2017-12-15 DIAGNOSIS — M7521 Bicipital tendinitis, right shoulder: Secondary | ICD-10-CM | POA: Diagnosis not present

## 2017-12-15 DIAGNOSIS — G8918 Other acute postprocedural pain: Secondary | ICD-10-CM | POA: Diagnosis not present

## 2017-12-15 DIAGNOSIS — M66811 Spontaneous rupture of other tendons, right shoulder: Secondary | ICD-10-CM | POA: Diagnosis not present

## 2017-12-15 DIAGNOSIS — M24111 Other articular cartilage disorders, right shoulder: Secondary | ICD-10-CM | POA: Diagnosis not present

## 2017-12-15 DIAGNOSIS — M24811 Other specific joint derangements of right shoulder, not elsewhere classified: Secondary | ICD-10-CM | POA: Diagnosis not present

## 2017-12-19 DIAGNOSIS — S46811D Strain of other muscles, fascia and tendons at shoulder and upper arm level, right arm, subsequent encounter: Secondary | ICD-10-CM | POA: Diagnosis not present

## 2017-12-19 DIAGNOSIS — M24111 Other articular cartilage disorders, right shoulder: Secondary | ICD-10-CM | POA: Diagnosis not present

## 2017-12-19 DIAGNOSIS — M7541 Impingement syndrome of right shoulder: Secondary | ICD-10-CM | POA: Diagnosis not present

## 2017-12-21 DIAGNOSIS — M24111 Other articular cartilage disorders, right shoulder: Secondary | ICD-10-CM | POA: Diagnosis not present

## 2017-12-21 DIAGNOSIS — S46811D Strain of other muscles, fascia and tendons at shoulder and upper arm level, right arm, subsequent encounter: Secondary | ICD-10-CM | POA: Diagnosis not present

## 2017-12-21 DIAGNOSIS — M7541 Impingement syndrome of right shoulder: Secondary | ICD-10-CM | POA: Diagnosis not present

## 2017-12-25 DIAGNOSIS — M7541 Impingement syndrome of right shoulder: Secondary | ICD-10-CM | POA: Diagnosis not present

## 2017-12-25 DIAGNOSIS — S46811D Strain of other muscles, fascia and tendons at shoulder and upper arm level, right arm, subsequent encounter: Secondary | ICD-10-CM | POA: Diagnosis not present

## 2017-12-25 DIAGNOSIS — M24111 Other articular cartilage disorders, right shoulder: Secondary | ICD-10-CM | POA: Diagnosis not present

## 2017-12-28 DIAGNOSIS — M7541 Impingement syndrome of right shoulder: Secondary | ICD-10-CM | POA: Diagnosis not present

## 2017-12-28 DIAGNOSIS — S46811D Strain of other muscles, fascia and tendons at shoulder and upper arm level, right arm, subsequent encounter: Secondary | ICD-10-CM | POA: Diagnosis not present

## 2017-12-28 DIAGNOSIS — M24111 Other articular cartilage disorders, right shoulder: Secondary | ICD-10-CM | POA: Diagnosis not present

## 2018-01-01 DIAGNOSIS — S46811D Strain of other muscles, fascia and tendons at shoulder and upper arm level, right arm, subsequent encounter: Secondary | ICD-10-CM | POA: Diagnosis not present

## 2018-01-01 DIAGNOSIS — M7541 Impingement syndrome of right shoulder: Secondary | ICD-10-CM | POA: Diagnosis not present

## 2018-01-01 DIAGNOSIS — M24111 Other articular cartilage disorders, right shoulder: Secondary | ICD-10-CM | POA: Diagnosis not present

## 2018-01-04 DIAGNOSIS — M7541 Impingement syndrome of right shoulder: Secondary | ICD-10-CM | POA: Diagnosis not present

## 2018-01-04 DIAGNOSIS — M24111 Other articular cartilage disorders, right shoulder: Secondary | ICD-10-CM | POA: Diagnosis not present

## 2018-01-04 DIAGNOSIS — S46811D Strain of other muscles, fascia and tendons at shoulder and upper arm level, right arm, subsequent encounter: Secondary | ICD-10-CM | POA: Diagnosis not present

## 2018-01-09 DIAGNOSIS — S46811D Strain of other muscles, fascia and tendons at shoulder and upper arm level, right arm, subsequent encounter: Secondary | ICD-10-CM | POA: Diagnosis not present

## 2018-01-09 DIAGNOSIS — M7541 Impingement syndrome of right shoulder: Secondary | ICD-10-CM | POA: Diagnosis not present

## 2018-01-09 DIAGNOSIS — M24111 Other articular cartilage disorders, right shoulder: Secondary | ICD-10-CM | POA: Diagnosis not present

## 2018-01-11 DIAGNOSIS — S46811D Strain of other muscles, fascia and tendons at shoulder and upper arm level, right arm, subsequent encounter: Secondary | ICD-10-CM | POA: Diagnosis not present

## 2018-01-11 DIAGNOSIS — M7541 Impingement syndrome of right shoulder: Secondary | ICD-10-CM | POA: Diagnosis not present

## 2018-01-11 DIAGNOSIS — M24111 Other articular cartilage disorders, right shoulder: Secondary | ICD-10-CM | POA: Diagnosis not present

## 2018-01-15 DIAGNOSIS — S46811D Strain of other muscles, fascia and tendons at shoulder and upper arm level, right arm, subsequent encounter: Secondary | ICD-10-CM | POA: Diagnosis not present

## 2018-01-15 DIAGNOSIS — M7541 Impingement syndrome of right shoulder: Secondary | ICD-10-CM | POA: Diagnosis not present

## 2018-01-15 DIAGNOSIS — M24111 Other articular cartilage disorders, right shoulder: Secondary | ICD-10-CM | POA: Diagnosis not present

## 2018-01-18 DIAGNOSIS — M7541 Impingement syndrome of right shoulder: Secondary | ICD-10-CM | POA: Diagnosis not present

## 2018-01-18 DIAGNOSIS — S46811D Strain of other muscles, fascia and tendons at shoulder and upper arm level, right arm, subsequent encounter: Secondary | ICD-10-CM | POA: Diagnosis not present

## 2018-01-18 DIAGNOSIS — M24111 Other articular cartilage disorders, right shoulder: Secondary | ICD-10-CM | POA: Diagnosis not present

## 2018-01-22 DIAGNOSIS — S46811D Strain of other muscles, fascia and tendons at shoulder and upper arm level, right arm, subsequent encounter: Secondary | ICD-10-CM | POA: Diagnosis not present

## 2018-01-22 DIAGNOSIS — M7541 Impingement syndrome of right shoulder: Secondary | ICD-10-CM | POA: Diagnosis not present

## 2018-01-22 DIAGNOSIS — M24111 Other articular cartilage disorders, right shoulder: Secondary | ICD-10-CM | POA: Diagnosis not present

## 2018-01-24 DIAGNOSIS — M24111 Other articular cartilage disorders, right shoulder: Secondary | ICD-10-CM | POA: Diagnosis not present

## 2018-01-24 DIAGNOSIS — S46811D Strain of other muscles, fascia and tendons at shoulder and upper arm level, right arm, subsequent encounter: Secondary | ICD-10-CM | POA: Diagnosis not present

## 2018-01-24 DIAGNOSIS — M7541 Impingement syndrome of right shoulder: Secondary | ICD-10-CM | POA: Diagnosis not present

## 2018-01-31 DIAGNOSIS — M24111 Other articular cartilage disorders, right shoulder: Secondary | ICD-10-CM | POA: Diagnosis not present

## 2018-01-31 DIAGNOSIS — M7541 Impingement syndrome of right shoulder: Secondary | ICD-10-CM | POA: Diagnosis not present

## 2018-01-31 DIAGNOSIS — M6281 Muscle weakness (generalized): Secondary | ICD-10-CM | POA: Diagnosis not present

## 2018-02-02 DIAGNOSIS — S46811D Strain of other muscles, fascia and tendons at shoulder and upper arm level, right arm, subsequent encounter: Secondary | ICD-10-CM | POA: Diagnosis not present

## 2018-02-02 DIAGNOSIS — M24111 Other articular cartilage disorders, right shoulder: Secondary | ICD-10-CM | POA: Diagnosis not present

## 2018-02-02 DIAGNOSIS — M7541 Impingement syndrome of right shoulder: Secondary | ICD-10-CM | POA: Diagnosis not present

## 2018-02-06 DIAGNOSIS — M24111 Other articular cartilage disorders, right shoulder: Secondary | ICD-10-CM | POA: Diagnosis not present

## 2018-02-06 DIAGNOSIS — M7541 Impingement syndrome of right shoulder: Secondary | ICD-10-CM | POA: Diagnosis not present

## 2018-02-06 DIAGNOSIS — S46811D Strain of other muscles, fascia and tendons at shoulder and upper arm level, right arm, subsequent encounter: Secondary | ICD-10-CM | POA: Diagnosis not present

## 2018-02-08 DIAGNOSIS — S46811D Strain of other muscles, fascia and tendons at shoulder and upper arm level, right arm, subsequent encounter: Secondary | ICD-10-CM | POA: Diagnosis not present

## 2018-02-08 DIAGNOSIS — M7541 Impingement syndrome of right shoulder: Secondary | ICD-10-CM | POA: Diagnosis not present

## 2018-02-08 DIAGNOSIS — M24111 Other articular cartilage disorders, right shoulder: Secondary | ICD-10-CM | POA: Diagnosis not present

## 2018-02-12 DIAGNOSIS — M7541 Impingement syndrome of right shoulder: Secondary | ICD-10-CM | POA: Diagnosis not present

## 2018-02-12 DIAGNOSIS — M24111 Other articular cartilage disorders, right shoulder: Secondary | ICD-10-CM | POA: Diagnosis not present

## 2018-02-12 DIAGNOSIS — S46811D Strain of other muscles, fascia and tendons at shoulder and upper arm level, right arm, subsequent encounter: Secondary | ICD-10-CM | POA: Diagnosis not present

## 2018-02-14 DIAGNOSIS — S46811D Strain of other muscles, fascia and tendons at shoulder and upper arm level, right arm, subsequent encounter: Secondary | ICD-10-CM | POA: Diagnosis not present

## 2018-02-14 DIAGNOSIS — M24111 Other articular cartilage disorders, right shoulder: Secondary | ICD-10-CM | POA: Diagnosis not present

## 2018-02-14 DIAGNOSIS — M7541 Impingement syndrome of right shoulder: Secondary | ICD-10-CM | POA: Diagnosis not present

## 2018-02-19 DIAGNOSIS — M7541 Impingement syndrome of right shoulder: Secondary | ICD-10-CM | POA: Diagnosis not present

## 2018-02-19 DIAGNOSIS — S46811D Strain of other muscles, fascia and tendons at shoulder and upper arm level, right arm, subsequent encounter: Secondary | ICD-10-CM | POA: Diagnosis not present

## 2018-02-19 DIAGNOSIS — M24111 Other articular cartilage disorders, right shoulder: Secondary | ICD-10-CM | POA: Diagnosis not present

## 2018-02-25 DIAGNOSIS — Z23 Encounter for immunization: Secondary | ICD-10-CM | POA: Diagnosis not present

## 2018-02-27 DIAGNOSIS — M7541 Impingement syndrome of right shoulder: Secondary | ICD-10-CM | POA: Diagnosis not present

## 2018-02-27 DIAGNOSIS — S46811D Strain of other muscles, fascia and tendons at shoulder and upper arm level, right arm, subsequent encounter: Secondary | ICD-10-CM | POA: Diagnosis not present

## 2018-02-27 DIAGNOSIS — M24111 Other articular cartilage disorders, right shoulder: Secondary | ICD-10-CM | POA: Diagnosis not present

## 2018-03-01 DIAGNOSIS — M24111 Other articular cartilage disorders, right shoulder: Secondary | ICD-10-CM | POA: Diagnosis not present

## 2018-03-01 DIAGNOSIS — M7541 Impingement syndrome of right shoulder: Secondary | ICD-10-CM | POA: Diagnosis not present

## 2018-03-01 DIAGNOSIS — S46811D Strain of other muscles, fascia and tendons at shoulder and upper arm level, right arm, subsequent encounter: Secondary | ICD-10-CM | POA: Diagnosis not present

## 2018-03-06 DIAGNOSIS — M7541 Impingement syndrome of right shoulder: Secondary | ICD-10-CM | POA: Diagnosis not present

## 2018-03-06 DIAGNOSIS — S46811D Strain of other muscles, fascia and tendons at shoulder and upper arm level, right arm, subsequent encounter: Secondary | ICD-10-CM | POA: Diagnosis not present

## 2018-03-06 DIAGNOSIS — M24111 Other articular cartilage disorders, right shoulder: Secondary | ICD-10-CM | POA: Diagnosis not present

## 2018-03-07 DIAGNOSIS — M7541 Impingement syndrome of right shoulder: Secondary | ICD-10-CM | POA: Diagnosis not present

## 2018-03-07 DIAGNOSIS — S46811D Strain of other muscles, fascia and tendons at shoulder and upper arm level, right arm, subsequent encounter: Secondary | ICD-10-CM | POA: Diagnosis not present

## 2018-03-07 DIAGNOSIS — M24111 Other articular cartilage disorders, right shoulder: Secondary | ICD-10-CM | POA: Diagnosis not present

## 2018-03-08 DIAGNOSIS — M7541 Impingement syndrome of right shoulder: Secondary | ICD-10-CM | POA: Diagnosis not present

## 2018-03-08 DIAGNOSIS — M24111 Other articular cartilage disorders, right shoulder: Secondary | ICD-10-CM | POA: Diagnosis not present

## 2018-03-08 DIAGNOSIS — S46811D Strain of other muscles, fascia and tendons at shoulder and upper arm level, right arm, subsequent encounter: Secondary | ICD-10-CM | POA: Diagnosis not present

## 2018-05-26 IMAGING — US US RENAL
1 series · 14 of 25 positions shown · non-contrast
Comparison: Renal ultrasound September 19, 2011

CLINICAL DATA: Microhematuria.

EXAM:
RENAL / URINARY TRACT ULTRASOUND COMPLETE

[Series 1: us renal · 0.19mm/px · 14 of 36 slices shown]
[im 1/36]
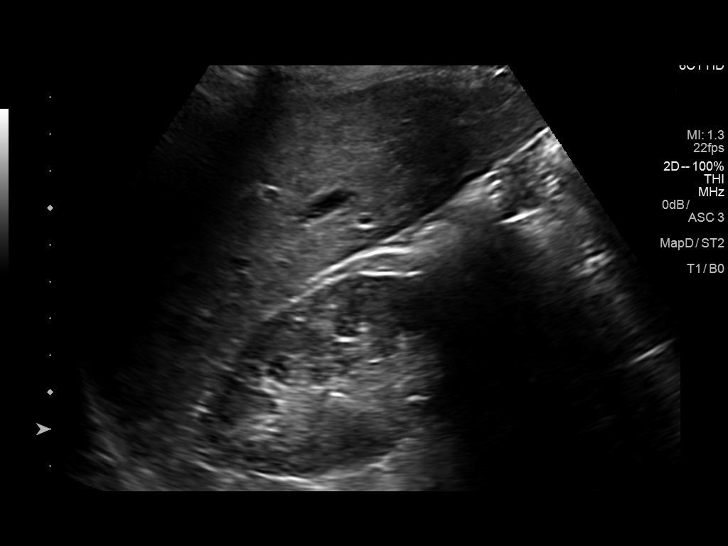
[im 3/36]
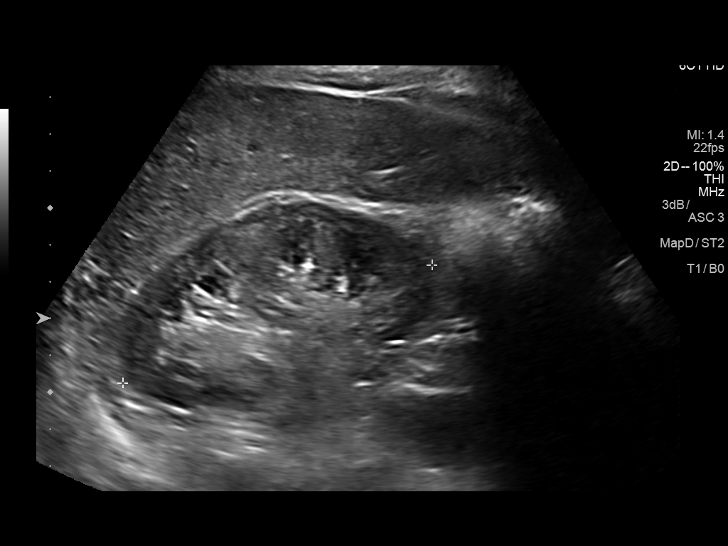
[im 6/36]
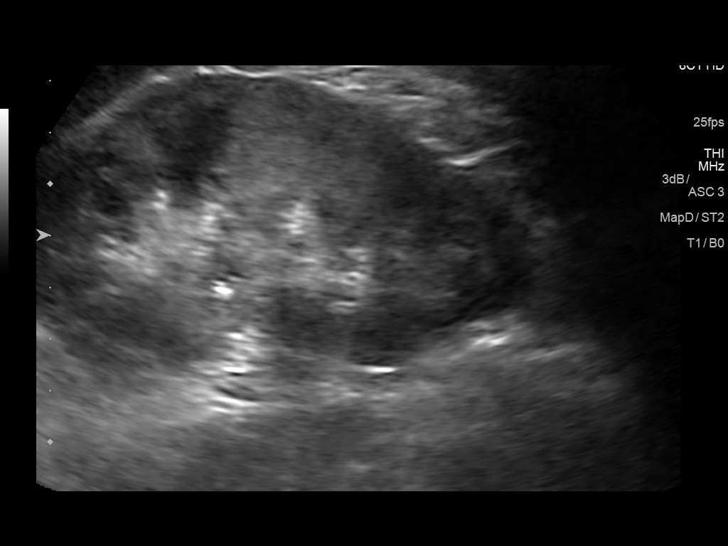
[im 9/36]
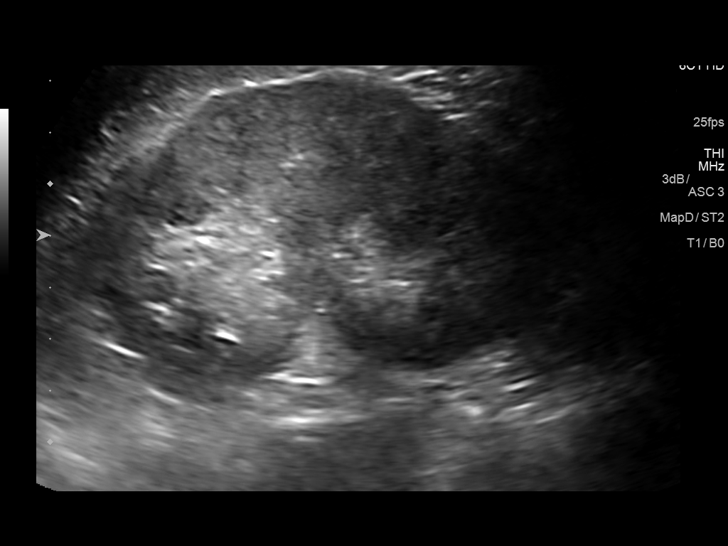
[im 12/36]
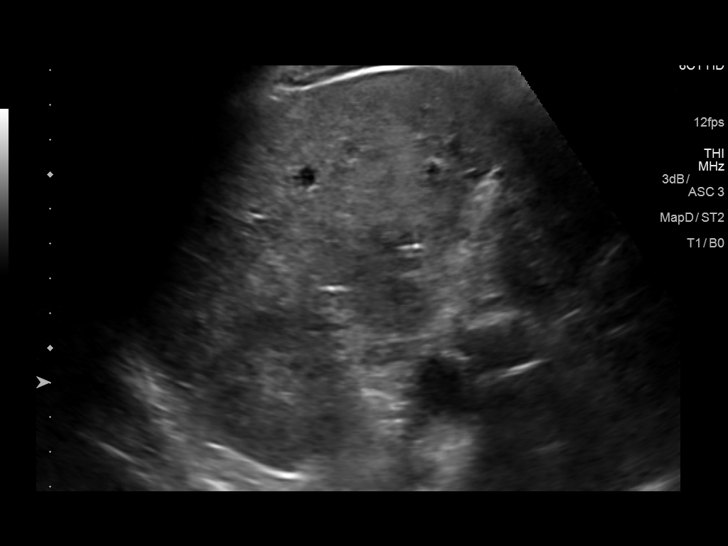
[im 14/36]
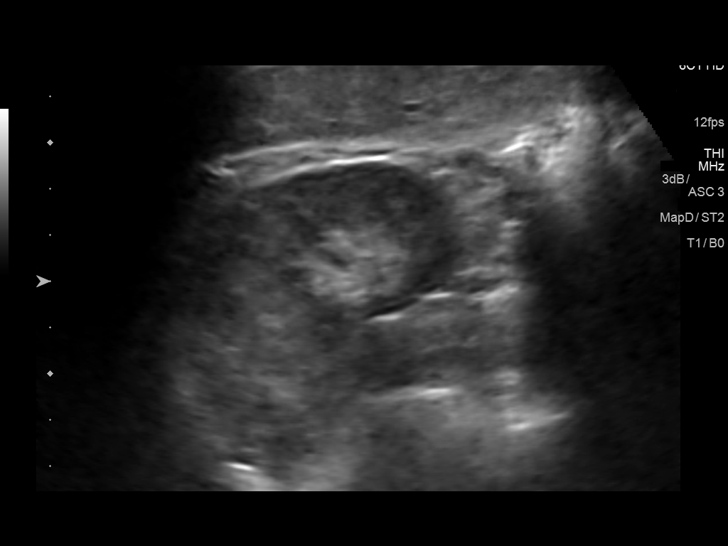
[im 17/36]
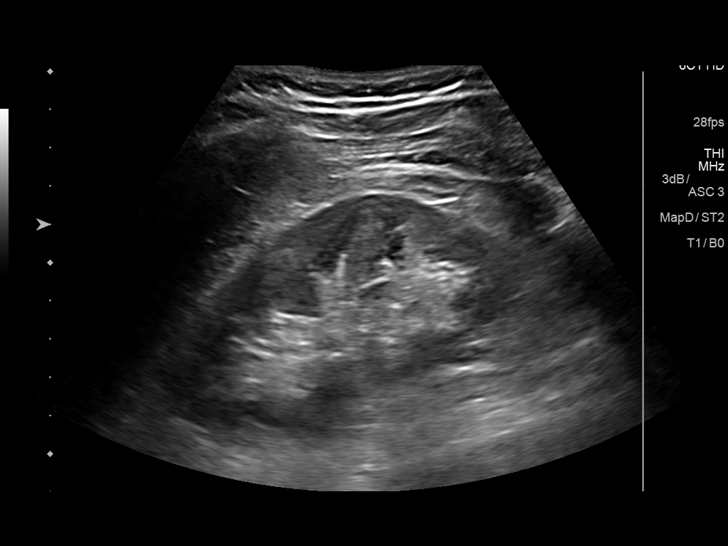
[im 19/36]
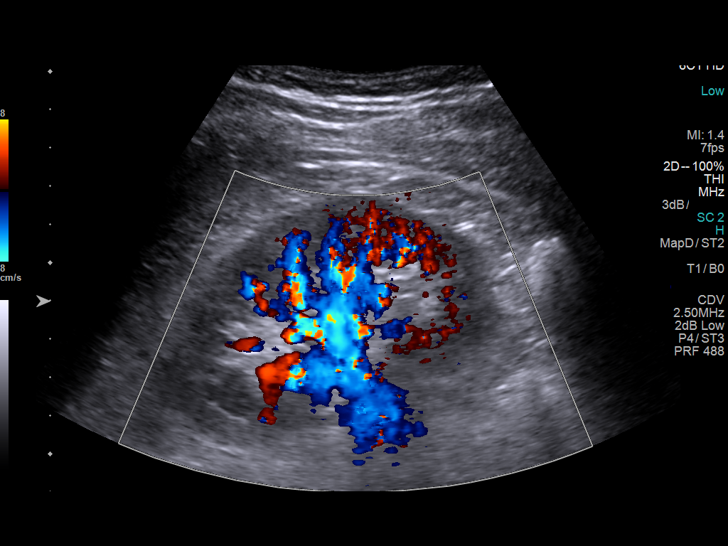
[im 22/36]
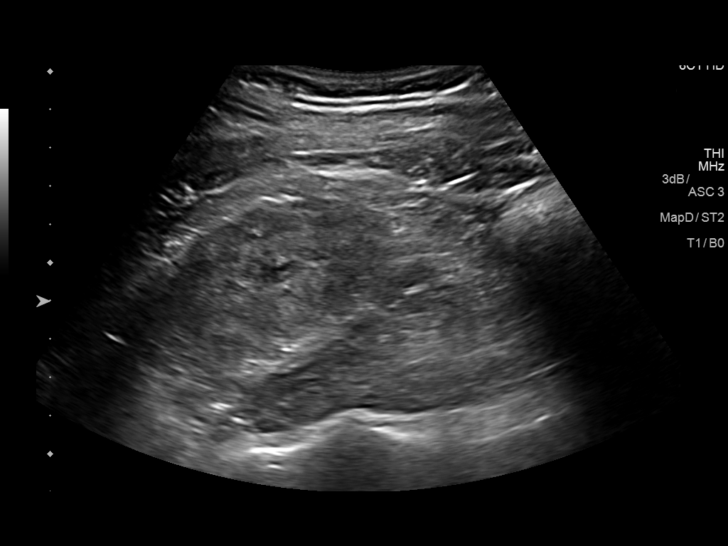
[im 24/36]
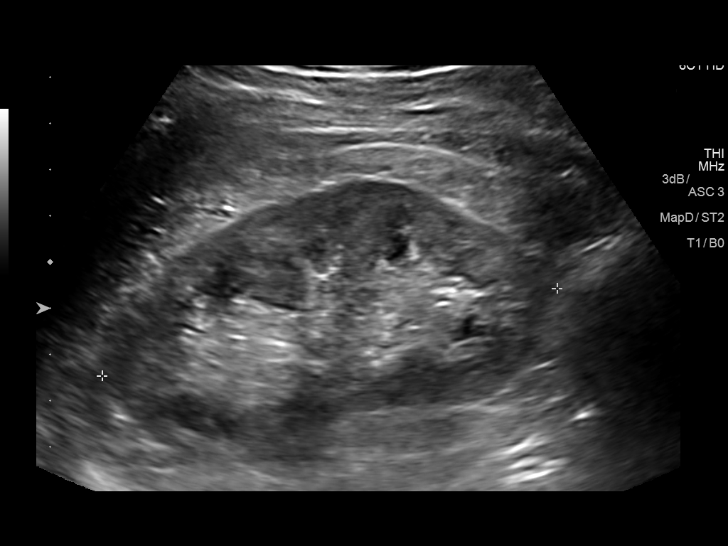
[im 27/36]
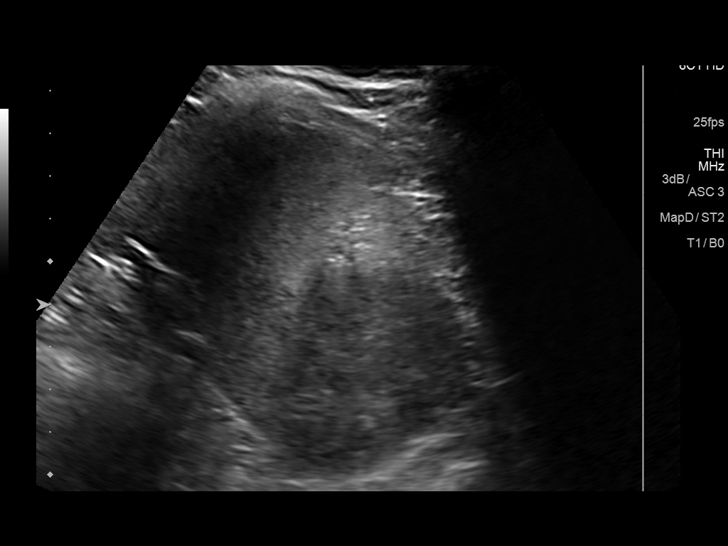
[im 30/36]
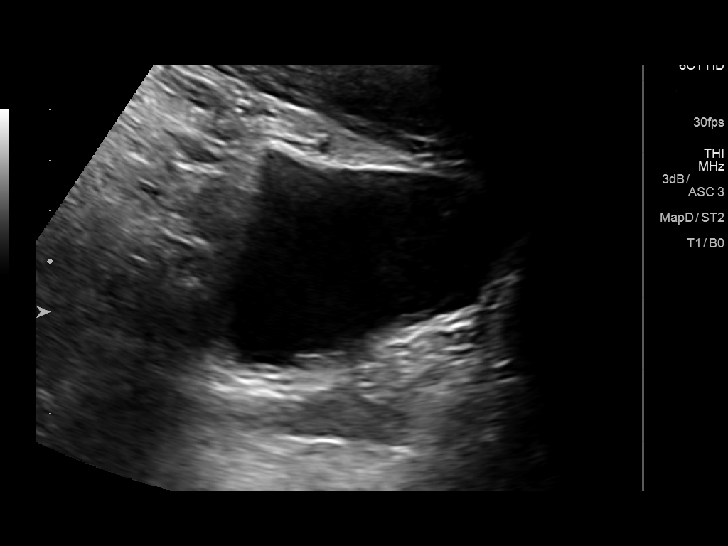
[im 33/36]
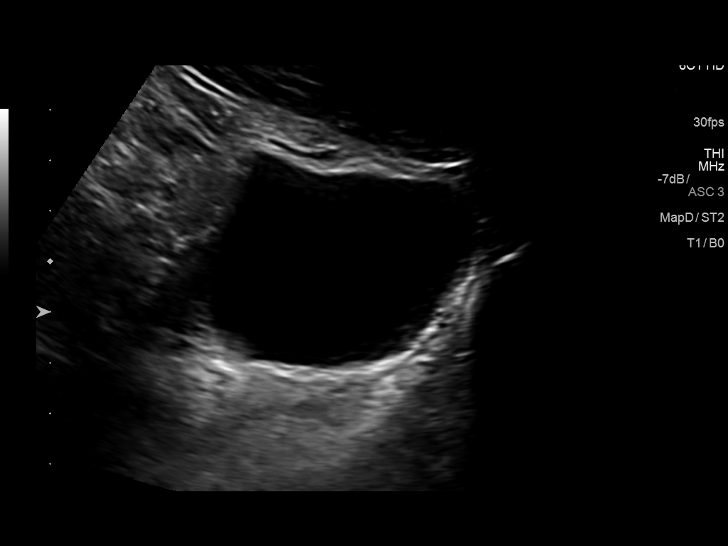
[im 36/36]
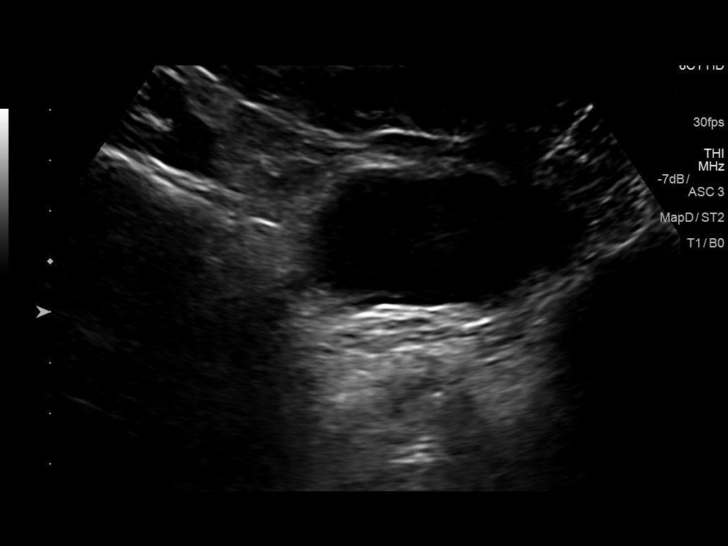

[14 of 25 positions shown; findings below may reference images not displayed]

FINDINGS: Right Kidney:

Length: 10.1 cm. The renal cortical echotexture remains lower than
that of the adjacent liver. There is no hydronephrosis. No cystic or
solid masses are observed. There may be a nonobstructing midpole
stone.

Left Kidney:

Length: 10.0 cm. The renal cortical echotexture is similar to that
on the right. There is no hydronephrosis. There is no cystic nor
solid mass. No calcified stones are observed.

Bladder:

Appears normal for degree of bladder distention.
IMPRESSION: Normal size kidneys. Normal renal cortical echotexture. No
hydronephrosis or parenchymal masses. Possible nonobstructing
midpole stone in the right kidney.

## 2018-06-03 ENCOUNTER — Emergency Department (HOSPITAL_COMMUNITY)
Admission: EM | Admit: 2018-06-03 | Discharge: 2018-06-03 | Disposition: A | Discharge status: Home / Self Care | Payer: No Typology Code available for payment source | Attending: Emergency Medicine | Admitting: Emergency Medicine

## 2018-06-03 ENCOUNTER — Encounter (HOSPITAL_COMMUNITY): Payer: Self-pay | Admitting: Emergency Medicine

## 2018-06-03 ENCOUNTER — Other Ambulatory Visit: Payer: Self-pay

## 2018-06-03 DIAGNOSIS — Z7721 Contact with and (suspected) exposure to potentially hazardous body fluids: Secondary | ICD-10-CM | POA: Diagnosis not present

## 2018-06-03 DIAGNOSIS — S6992XA Unspecified injury of left wrist, hand and finger(s), initial encounter: Secondary | ICD-10-CM | POA: Diagnosis present

## 2018-06-03 DIAGNOSIS — S60512A Abrasion of left hand, initial encounter: Secondary | ICD-10-CM | POA: Insufficient documentation

## 2018-06-03 DIAGNOSIS — Z79899 Other long term (current) drug therapy: Secondary | ICD-10-CM | POA: Diagnosis not present

## 2018-06-03 DIAGNOSIS — X58XXXA Exposure to other specified factors, initial encounter: Secondary | ICD-10-CM | POA: Diagnosis not present

## 2018-06-03 DIAGNOSIS — Y990 Civilian activity done for income or pay: Secondary | ICD-10-CM | POA: Diagnosis not present

## 2018-06-03 DIAGNOSIS — Y9289 Other specified places as the place of occurrence of the external cause: Secondary | ICD-10-CM | POA: Insufficient documentation

## 2018-06-03 DIAGNOSIS — Y9389 Activity, other specified: Secondary | ICD-10-CM | POA: Diagnosis not present

## 2018-06-03 DIAGNOSIS — T148XXA Other injury of unspecified body region, initial encounter: Secondary | ICD-10-CM

## 2018-06-03 LAB — RAPID HIV SCREEN (HIV 1/2 AB+AG)
HIV 1/2 Antibodies: NONREACTIVE
HIV-1 P24 ANTIGEN - HIV24: NONREACTIVE
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
## 2018-06-03 NOTE — ED Notes (Signed)
HIV: Non-reactive per lab

## 2018-06-03 NOTE — ED Notes (Signed)
Bed: WTR6 Expected date:  Expected time:  Means of arrival:  Comments: 

## 2018-06-03 NOTE — Discharge Instructions (Signed)
You will be called by phone if any results from source patient or from your blood work are positive today.  No medications or treatment needed at this time.
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
## 2018-06-05 LAB — HCV COMMENT:

## 2018-06-05 LAB — HEPATITIS C ANTIBODY (REFLEX)

## 2018-06-05 LAB — HEPATITIS B SURFACE ANTIGEN: HEP B S AG: NEGATIVE

## 2019-05-17 ENCOUNTER — Ambulatory Visit: Payer: 59 | Attending: Internal Medicine

## 2019-05-17 DIAGNOSIS — Z23 Encounter for immunization: Secondary | ICD-10-CM | POA: Insufficient documentation

## 2019-05-17 NOTE — Progress Notes (Signed)
   Covid-19 Vaccination Clinic  Name:  Randall Webb    MRN: [id number] DOB: Feb 13, 1981  05/17/2019  [name] was observed post Covid-19 immunization for 15 minutes without incidence. He was provided with Vaccine Information Sheet and instruction to access the V-Safe system.   [name] was instructed to call 911 with any severe reactions post vaccine: Marland Kitchen Difficulty breathing  . Swelling of your face and throat  . A fast heartbeat  . A bad rash all over your body  . Dizziness and weakness    Immunizations Administered    Name Date Dose VIS Date Route   Pfizer COVID-19 Vaccine 05/17/2019  1:24 PM 0.3 mL 03/08/2019 Intramuscular   Manufacturer: [employer], [employer]   Lot: [id number]   NDC: [address]-8

## 2019-06-11 ENCOUNTER — Ambulatory Visit: Payer: 59 | Attending: Internal Medicine

## 2019-06-11 DIAGNOSIS — Z23 Encounter for immunization: Secondary | ICD-10-CM

## 2019-06-11 NOTE — Progress Notes (Signed)
   Covid-19 Vaccination Clinic  Name:  MORDECHAI MATUSZAK    MRN: [id number] DOB: January 06, 1981  06/11/2019  Mr. Garlitz was observed post Covid-19 immunization for 30 minutes without incident. He was provided with Vaccine Information Sheet and instruction to access the V-Safe system.   Mr. Munter was instructed to call 911 with any severe reactions post vaccine: Marland Kitchen Difficulty breathing  . Swelling of face and throat  . A fast heartbeat  . A bad rash all over body  . Dizziness and weakness   Immunizations Administered    Name Date Dose VIS Date Route   Pfizer COVID-19 Vaccine 06/11/2019  8:36 AM 0.3 mL 03/08/2019 Intramuscular   Manufacturer: [employer], [employer]   Lot: [id number]   NDC: [address]-4

## 2021-02-26 ENCOUNTER — Ambulatory Visit: Payer: BC Managed Care – PPO | Admitting: Neurology

## 2022-03-18 ENCOUNTER — Other Ambulatory Visit: Payer: Self-pay

## 2022-03-18 ENCOUNTER — Emergency Department (HOSPITAL_BASED_OUTPATIENT_CLINIC_OR_DEPARTMENT_OTHER)
Admission: EM | Admit: 2022-03-18 | Discharge: 2022-03-18 | Disposition: A | Discharge status: Home / Self Care | Payer: BC Managed Care – PPO | Attending: Emergency Medicine | Admitting: Emergency Medicine

## 2022-03-18 ENCOUNTER — Encounter (HOSPITAL_COMMUNITY): Payer: Self-pay

## 2022-03-18 ENCOUNTER — Emergency Department (HOSPITAL_BASED_OUTPATIENT_CLINIC_OR_DEPARTMENT_OTHER): Payer: BC Managed Care – PPO

## 2022-03-18 DIAGNOSIS — R7989 Other specified abnormal findings of blood chemistry: Secondary | ICD-10-CM

## 2022-03-18 DIAGNOSIS — I10 Essential (primary) hypertension: Secondary | ICD-10-CM | POA: Insufficient documentation

## 2022-03-18 DIAGNOSIS — R944 Abnormal results of kidney function studies: Secondary | ICD-10-CM | POA: Insufficient documentation

## 2022-03-18 DIAGNOSIS — R109 Unspecified abdominal pain: Secondary | ICD-10-CM | POA: Insufficient documentation

## 2022-03-18 DIAGNOSIS — R7401 Elevation of levels of liver transaminase levels: Secondary | ICD-10-CM | POA: Diagnosis not present

## 2022-03-18 DIAGNOSIS — A084 Viral intestinal infection, unspecified: Secondary | ICD-10-CM

## 2022-03-18 DIAGNOSIS — D72829 Elevated white blood cell count, unspecified: Secondary | ICD-10-CM | POA: Diagnosis not present

## 2022-03-18 DIAGNOSIS — R112 Nausea with vomiting, unspecified: Secondary | ICD-10-CM | POA: Diagnosis not present

## 2022-03-18 DIAGNOSIS — K5730 Diverticulosis of large intestine without perforation or abscess without bleeding: Secondary | ICD-10-CM

## 2022-03-18 DIAGNOSIS — Z20822 Contact with and (suspected) exposure to covid-19: Secondary | ICD-10-CM | POA: Insufficient documentation

## 2022-03-18 DIAGNOSIS — E860 Dehydration: Secondary | ICD-10-CM | POA: Diagnosis not present

## 2022-03-18 DIAGNOSIS — R197 Diarrhea, unspecified: Secondary | ICD-10-CM | POA: Diagnosis not present

## 2022-03-18 LAB — URINALYSIS, ROUTINE W REFLEX MICROSCOPIC
Cellular Cast, UA: 5
Glucose, UA: NEGATIVE mg/dL
Ketones, ur: 15 mg/dL — AB
Nitrite: NEGATIVE
Protein, ur: 30 mg/dL — AB
Specific Gravity, Urine: 1.029 (ref 1.005–1.030)
pH: 5.5 (ref 5.0–8.0)

## 2022-03-18 LAB — RESP PANEL BY RT-PCR (RSV, FLU A&B, COVID)  RVPGX2
Influenza A by PCR: NEGATIVE
Influenza B by PCR: NEGATIVE
Resp Syncytial Virus by PCR: NEGATIVE
SARS Coronavirus 2 by RT PCR: NEGATIVE

## 2022-03-18 LAB — CBC
HCT: 48.3 % (ref 39.0–52.0)
Hemoglobin: 16.1 g/dL (ref 13.0–17.0)
MCH: 30.8 pg (ref 26.0–34.0)
MCHC: 33.3 g/dL (ref 30.0–36.0)
MCV: 92.5 fL (ref 80.0–100.0)
Platelets: 156 10*3/uL (ref 150–400)
RBC: 5.22 MIL/uL (ref 4.22–5.81)
RDW: 14 % (ref 11.5–15.5)
WBC: 11.9 10*3/uL — ABNORMAL HIGH (ref 4.0–10.5)
nRBC: 0 % (ref 0.0–0.2)

## 2022-03-18 LAB — BASIC METABOLIC PANEL
Anion gap: 9 (ref 5–15)
BUN: 16 mg/dL (ref 6–20)
CO2: 24 mmol/L (ref 22–32)
Calcium: 6.7 mg/dL — ABNORMAL LOW (ref 8.9–10.3)
Chloride: 104 mmol/L (ref 98–111)
Creatinine, Ser: 2.18 mg/dL — ABNORMAL HIGH (ref 0.61–1.24)
GFR, Estimated: 38 mL/min — ABNORMAL LOW (ref >60)
Glucose, Bld: 108 mg/dL — ABNORMAL HIGH (ref 70–99)
Potassium: 4.3 mmol/L (ref 3.5–5.1)
Sodium: 137 mmol/L (ref 135–145)

## 2022-03-18 LAB — HEPATITIS PANEL, ACUTE
HCV Ab: NONREACTIVE
Hep A IgM: NONREACTIVE
Hep B C IgM: NONREACTIVE
Hepatitis B Surface Ag: NONREACTIVE

## 2022-03-18 LAB — LIPASE, BLOOD: Lipase: 25 U/L (ref 11–51)

## 2022-03-18 LAB — COMPREHENSIVE METABOLIC PANEL
ALT: 75 U/L — ABNORMAL HIGH (ref 0–44)
AST: 66 U/L — ABNORMAL HIGH (ref 15–41)
Albumin: 4.2 g/dL (ref 3.5–5.0)
Alkaline Phosphatase: 30 U/L — ABNORMAL LOW (ref 38–126)
Anion gap: 11 (ref 5–15)
BUN: 16 mg/dL (ref 6–20)
CO2: 22 mmol/L (ref 22–32)
Calcium: 7.9 mg/dL — ABNORMAL LOW (ref 8.9–10.3)
Chloride: 101 mmol/L (ref 98–111)
Creatinine, Ser: 2.29 mg/dL — ABNORMAL HIGH (ref 0.61–1.24)
GFR, Estimated: 36 mL/min — ABNORMAL LOW (ref >60)
Glucose, Bld: 123 mg/dL — ABNORMAL HIGH (ref 70–99)
Potassium: 4.1 mmol/L (ref 3.5–5.1)
Sodium: 134 mmol/L — ABNORMAL LOW (ref 135–145)
Total Bilirubin: 0.8 mg/dL (ref 0.3–1.2)
Total Protein: 7.1 g/dL (ref 6.5–8.1)

## 2022-03-18 MED ORDER — ONDANSETRON 4 MG PO TBDP: 4.0000 mg | ORAL_TABLET | Freq: Three times a day (TID) | ORAL | 0 refills | Status: AC | PRN

## 2022-03-18 MED ORDER — SODIUM CHLORIDE 0.9 % IV BOLUS
1000.0000 mL | Freq: Once | INTRAVENOUS | Status: AC
  Administered 2022-03-18: 1000 mL via INTRAVENOUS

## 2022-03-18 MED ORDER — ONDANSETRON HCL 4 MG/2ML IJ SOLN
4.0000 mg | Freq: Once | INTRAMUSCULAR | Status: AC
  Administered 2022-03-18: 4 mg via INTRAVENOUS
  Filled 2022-03-18: qty 2

## 2022-03-18 MED ORDER — KETOROLAC TROMETHAMINE 30 MG/ML IJ SOLN
30.0000 mg | Freq: Once | INTRAMUSCULAR | Status: AC
  Administered 2022-03-18: 30 mg via INTRAVENOUS
  Filled 2022-03-18: qty 1

## 2022-03-18 MED ORDER — DICYCLOMINE HCL 20 MG PO TABS: 20.0000 mg | ORAL_TABLET | Freq: Two times a day (BID) | ORAL | 0 refills | Status: AC | PRN

## 2022-03-18 MED ORDER — SODIUM CHLORIDE 0.9 % IV SOLN: Freq: Once | INTRAVENOUS | Status: AC

## 2022-03-18 NOTE — Discharge Instructions (Addendum)
Please contact your doctors office about having your basic metabolic panel rechecked in 1 week.  This would be a measurement of your kidney function as well as your calcium level, both of which are abnormal.  Your calcium level was lower than normal.  I recommend that you take 2 over-the-counter Tums daily for the next 4 weeks.  This will help with your calcium levels.  I also recommend you stay hydrated with water, and monitor your blood pressure at home.  Because your kidney function has been trending in abnormal direction, I recommend you make an appointment with the nephrologist for an office evaluation.

## 2022-03-18 NOTE — ED Triage Notes (Signed)
Pt. Presents from home, Randall Webb, ambulatory. States he has been having NVD x5 days, states he has taken all of the OTC medications and zofran but nothing has helped. States vomiting has stopped x3 days but still has diarrhea. Pt states daughter was sick with stomach bug at home.

## 2022-03-18 NOTE — ED Provider Notes (Signed)
[facility] EMERGENCY DEPT Provider Note   CSN: [id number] Arrival date & time: 03/18/22  0435     History  Chief Complaint  Patient presents with   Abdominal Pain    Randall Webb is a 41 y.o. male.  Patient is a 41 year old male with past medical history of hypertension.  Patient presenting today for evaluation of nausea vomiting and diarrhea.  This has been persistent for the past 5 days.  He reports his daughter was ill with similar complaints, however got better after couple of days, however his symptoms are lingering.  He denies any bloody stools.  He does describe feeling fevered, chills, and bodyaches.  The history is provided by the patient.       Home Medications Prior to Admission medications   Medication Sig Start Date End Date Taking? Authorizing Provider  fexofenadine (ALLEGRA) 180 MG tablet Take 180 mg by mouth daily.    [provider]  HYDROcodone-acetaminophen (NORCO) 5-325 MG per tablet Take 1-2 tablets by mouth every 6 (six) hours as needed for moderate pain. 12/15/14   Altamese Cabal, PA-C  ibuprofen (ADVIL,MOTRIN) 800 MG tablet Take 1 tablet (800 mg total) by mouth 3 (three) times daily. 12/15/14   Altamese Cabal, PA-C      Allergies    Patient has no known allergies.    Review of Systems   Review of Systems  All other systems reviewed and are negative.   Physical Exam Updated Vital Signs BP 113/75 (BP Location: Right Arm)   Pulse (!) 107   Temp 99.7 F (37.6 C) (Oral)   Resp 18   Ht 5\' 11"  (1.803 m)   Wt 81.6 kg   SpO2 95%   BMI 25.10 kg/m  Physical Exam Vitals and nursing note reviewed.  Constitutional:      General: He is not in acute distress.    Appearance: He is well-developed. He is not diaphoretic.  HENT:     Head: Normocephalic and atraumatic.  Cardiovascular:     Rate and Rhythm: Normal rate and regular rhythm.     Heart sounds: No murmur heard.    No friction rub.  Pulmonary:     Effort:  Pulmonary effort is normal. No respiratory distress.     Breath sounds: Normal breath sounds. No wheezing or rales.  Abdominal:     General: Bowel sounds are normal. There is no distension.     Palpations: Abdomen is soft.     Tenderness: There is no abdominal tenderness.  Musculoskeletal:        General: Normal range of motion.     Cervical back: Normal range of motion and neck supple.  Skin:    General: Skin is warm and dry.  Neurological:     Mental Status: He is alert and oriented to person, place, and time.     Coordination: Coordination normal.     ED Results / Procedures / Treatments   Labs (all labs ordered are listed, but only abnormal results are displayed) Labs Reviewed  CBC - Abnormal; Notable for the following components:      Result Value   WBC 11.9 (*)    All other components within normal limits  RESP PANEL BY RT-PCR (RSV, FLU A&B, COVID)  RVPGX2  LIPASE, BLOOD  COMPREHENSIVE METABOLIC PANEL  URINALYSIS, ROUTINE W REFLEX MICROSCOPIC    EKG None  Radiology No results found.  Procedures Procedures    Medications Ordered in ED Medications  ketorolac (TORADOL) 30 MG/ML  injection 30 mg (has no administration in time range)  ondansetron (ZOFRAN) injection 4 mg (has no administration in time range)    ED Course/ Medical Decision Making/ A&P  Patient is a 41 year old male presenting with complaints of nausea, vomiting, diarrhea.  That has been ongoing for the past 5 days.  He reports his daughter being ill with similar symptoms, but hers only lasted for 2 days.  Patient presents here feeling weak, fatigued, and is complaining of bodyaches.  Patient arrives with stable vital signs and is afebrile.  Physical examination is unremarkable and abdomen is benign.  Workup initiated including CBC, CMP, and lipase.  He has a slight leukocytosis and mild elevations of his AST, ALT, and creatinine.  His GFR is 36 consistent with dehydration.  Urinalysis also  obtained showing ketones and protein, but no evidence for infection.  Patient was hydrated with 2 L of normal saline and received Zofran for nausea and Toradol for pain prior to the results of his BMP.  He reports symptomatic improvement afterward.  Care was discussed with Dr. Loney Loh from the hospitalist service.  We suggested overnight observation for hydration and monitoring of his renal function, however patient is hesitant to be admitted.  At this point, I have agreed to give 1/3 L of saline and will recheck the patient's renal function.  If improving I feel as though he can go home with close follow-up, Zofran, and clear liquids.  Care to be signed out to oncoming provider at shift change to the follow-up the results of the metabolic panel and determine final disposition.  Final Clinical Impression(s) / ED Diagnoses Final diagnoses:  None    Rx / DC Orders ED Discharge Orders     None         Geoffery Lyons, MD 03/18/22 [phone]

## 2022-03-18 NOTE — ED Provider Notes (Signed)
41 yo male here with n/v/diarrhea x 5 days. Daughter was sick with similar symptoms recently but had improved.  Labs show Cr 2.3, AST/ALT mildly elevated, CT abdomen without evident abnormalities  Patient did not want medical admission  Physical Exam  BP 111/72   Pulse 85   Temp 99.7 F (37.6 C) (Oral)   Resp 18   Ht 5\' 11"  (1.803 m)   Wt 81.6 kg   SpO2 98%   BMI 25.10 kg/m   Physical Exam  Procedures  Procedures  ED Course / MDM    Medical Decision Making Amount and/or Complexity of Data Reviewed Labs: ordered. Radiology: ordered.  Risk Prescription drug management.   Pending reassessment after 3rd liter of IV fluids.  Patient is wanting to go home.  Will eval PO challenge and repeat BMP  *  Labs reviewed.  Repeat BMP shows some improvement of the creatinine level but it is still elevated.  I discussed with the patient who is aware that he has had an abnormal kidney function test.  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  He reports for many years where he had untreated hypertension but he is now on hypertensive medications.    I am not certain about the clinical significance of the steroids that he you are taking, whether this may be affecting his kidney function.  I think is reasonable to provide him a referral to nephrology or at least the office information to schedule an evaluation.  He is grateful for this.  We also discussed his low calcium level, which may be again related to the dietary issues he is having this week.  I recommended that he takes 1000 mg tums daily for the next 4 weeks -and that he talk to his doctor about rechecking his BMP in the next week if possible.  Otherwise he is stable for discharge.  I suspect this is a viral gastroenteritis.       , MD 03/18/22 [phone]

## 2022-04-22 ENCOUNTER — Other Ambulatory Visit: Payer: Self-pay | Admitting: Internal Medicine

## 2022-04-22 DIAGNOSIS — N179 Acute kidney failure, unspecified: Secondary | ICD-10-CM

## 2022-05-10 ENCOUNTER — Ambulatory Visit
Admission: RE | Admit: 2022-05-10 | Discharge: 2022-05-10 | Disposition: A | Discharge status: Home / Self Care | Payer: BC Managed Care – PPO | Source: Ambulatory Visit | Attending: Internal Medicine | Admitting: Internal Medicine

## 2022-05-10 DIAGNOSIS — N179 Acute kidney failure, unspecified: Secondary | ICD-10-CM
